# Patient Record
Sex: Female | Born: 2005 | Race: Black or African American | Hispanic: No | Marital: Single | Smoking: Current every day smoker
Health system: Southern US, Community
[De-identification: ages and names within clinical notes are randomized; demographics above are authoritative.]

## PROBLEM LIST (undated history)

## (undated) DIAGNOSIS — R569 Unspecified convulsions: Secondary | ICD-10-CM

## (undated) DIAGNOSIS — T7840XA Allergy, unspecified, initial encounter: Secondary | ICD-10-CM

## (undated) DIAGNOSIS — E669 Obesity, unspecified: Secondary | ICD-10-CM

## (undated) DIAGNOSIS — D573 Sickle-cell trait: Secondary | ICD-10-CM

## (undated) HISTORY — DX: Unspecified convulsions: R56.9

## (undated) HISTORY — PX: TONSILLECTOMY: SUR1361

## (undated) HISTORY — DX: Sickle-cell trait: D57.3

## (undated) HISTORY — DX: Allergy, unspecified, initial encounter: T78.40XA

## (undated) HISTORY — DX: Obesity, unspecified: E66.9

---
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
## 2023-01-11 ENCOUNTER — Ambulatory Visit: Payer: Medicaid Other

## 2023-04-12 ENCOUNTER — Ambulatory Visit

## 2023-04-12 DIAGNOSIS — F331 Major depressive disorder, recurrent, moderate: Secondary | ICD-10-CM | POA: Diagnosis not present

## 2023-04-12 NOTE — BH Specialist Note (Addendum)
 Integrated Behavioral Health via Telemedicine Visit  04/12/2023 Hayley Gould [id number]  Number of Integrated Behavioral Health Clinician visits: 9 Session Start time: 10:20am Session End time: 11:18am Total time in minutes: 58 mins  Referring Provider: Dr. Karilyn Gould Patient/Family location: Home [facility] Provider location: Clinic All persons participating in visit: Patient and Clinician  Types of Service: Individual psychotherapy and Video visit  I connected with Hayley Gould via [employer]  (Video is [employer], mining) and verified that I am speaking with the correct person using two identifiers. Discussed confidentiality: Yes   I discussed the limitations of telemedicine and the availability of in person appointments.  Discussed there is a possibility of technology failure and discussed alternative modes of communication if that failure occurs.  I discussed that engaging in this telemedicine visit, they consent to the provision of behavioral healthcare and the services will be billed under their insurance.  Patient and/or legal guardian expressed understanding and consented to Telemedicine visit: Yes   Presenting Concerns: Patient and/or family reports the following symptoms/concerns: The Patient reports that he has been experiencing increased stress at home and conflict with Mom recently.  Duration of problem: about there months; Severity of problem: moderate  Patient and/or Family's Strengths/Protective Factors: Social connections and Sense of purpose  Goals Addressed: Patient will:  Reduce symptoms of: agitation, anxiety, and stress   Increase knowledge and/or ability of: coping skills, healthy habits, and stress reduction   Demonstrate ability to: Increase healthy adjustment to current life circumstances, Increase adequate support systems for patient/family, and Increase motivation to adhere to plan of care  Progress towards  Goals: Ongoing  Interventions: Interventions utilized:  Solution-Focused Strategies, CBT Cognitive Behavioral Therapy, and Communication Skills Standardized Assessments completed: Not Needed  Patient and/or Family Response: The Patient is easily engaged and able to explore stressors within family dynamics as well as triggers associated with childhood triggers.  Assessment: Patient currently experiencing increased anger and stress related to finances and role expectations in the household.  The Patient reports that despite increase in depressive symptoms he has been able to maintain positive functioning including positive relationship dynamics with significant other, work expectations at a new job and efforts to keep up with financial expectations. The Clinician explored stressors, patterns of co-dependence with Mom and triggers of abandonment related to the Patient's efforts to shift focus to his new partner and decrease enmeshment with Mom.  The Clinician used CBT to explore points of change and reframing to help shift focus to more tangible and measurable outcomes.  The Clinician reviewed I statements and communication tools to help maintain goal directed verbal and behavioral expression with others.  The Clinician challenged retaliation patterns when triggered and explored secondary gains with improving skills coping with triggers that carry over to other relationships outside of Mom also.  Patient may benefit from follow up in about two weeks for a family session with Mom.  Plan: Follow up with behavioral health clinician in about two weeks Behavioral recommendations: continue therapy Referral(s): Integrated [employer] (In Clinic)  I discussed the assessment and treatment plan with the patient and/or parent/guardian. They were provided an opportunity to ask questions and all were answered. They agreed with the plan and demonstrated an understanding of the instructions.   They  were advised to call back or seek an in-person evaluation if the symptoms worsen or if the condition fails to improve as anticipated.  Katheran Awe, [facility]

## 2023-04-25 ENCOUNTER — Ambulatory Visit: Payer: Self-pay

## 2023-04-25 NOTE — BH Specialist Note (Deleted)
 Integrated Behavioral Health Follow Up In-Person Visit  MRN: [id number] Name: VIERA OKONSKI  Number of Integrated Behavioral Health Clinician visits: No data recorded Session Start time: No data recorded  Session End time: No data recorded Total time in minutes: No data recorded  Types of Service: {CHL AMB TYPE OF [phone]}  Interpretor:{yes [id number]} Interpretor Name and Language: ***  Subjective: ELLEEN COULIBALY is a 18 y.o. adult accompanied by {Patient accompanied by:[phone]} Patient was referred by *** for ***. Patient reports the following symptoms/concerns: *** Duration of problem: ***; Severity of problem: {Mild/Moderate/Severe:20260}  Objective: Mood: {BHH MOOD:22306} and Affect: {BHH AFFECT:22307} Risk of harm to self or others: {CHL AMB BH Suicide Current Mental Status:21022748}  Life Context: Family and Social: *** School/Work: *** Self-Care: *** Life Changes: ***  Patient and/or Family's Strengths/Protective Factors: {CHL AMB BH PROTECTIVE [phone]}  Goals Addressed: Patient will:  Reduce symptoms of: {IBH Symptoms:21014056}   Increase knowledge and/or ability of: {IBH Patient Tools:21014057}   Demonstrate ability to: {IBH Goals:21014053}  Progress towards Goals: {CHL AMB BH PROGRESS TOWARDS GOALS:[phone]}  Interventions: Interventions utilized:  {IBH Interventions:21014054} Standardized Assessments completed: {IBH Screening Tools:21014051}  Patient and/or Family Response: ***  Patient Centered Plan: Patient is on the following Treatment Plan(s): *** Assessment: Patient currently experiencing ***.   Patient may benefit from ***.  Plan: Follow up with behavioral health clinician on : *** Behavioral recommendations: *** Referral(s): {IBH Referrals:21014055} "From scale of 1-10, how likely are you to follow plan?": ***  Katheran Awe, [facility]

## 2023-05-13 ENCOUNTER — Emergency Department (HOSPITAL_COMMUNITY)

## 2023-05-13 ENCOUNTER — Other Ambulatory Visit: Payer: Self-pay

## 2023-05-13 ENCOUNTER — Encounter (HOSPITAL_COMMUNITY): Payer: Self-pay

## 2023-05-13 ENCOUNTER — Emergency Department (HOSPITAL_COMMUNITY)
Admission: EM | Admit: 2023-05-13 | Discharge: 2023-05-13 | Disposition: A | Discharge status: Home / Self Care | Attending: Emergency Medicine | Admitting: Emergency Medicine

## 2023-05-13 DIAGNOSIS — K921 Melena: Secondary | ICD-10-CM | POA: Diagnosis not present

## 2023-05-13 DIAGNOSIS — K625 Hemorrhage of anus and rectum: Secondary | ICD-10-CM | POA: Diagnosis not present

## 2023-05-13 DIAGNOSIS — R197 Diarrhea, unspecified: Secondary | ICD-10-CM | POA: Diagnosis not present

## 2023-05-13 DIAGNOSIS — R1032 Left lower quadrant pain: Secondary | ICD-10-CM | POA: Diagnosis not present

## 2023-05-13 DIAGNOSIS — Z9049 Acquired absence of other specified parts of digestive tract: Secondary | ICD-10-CM | POA: Diagnosis not present

## 2023-05-13 LAB — URINALYSIS, ROUTINE W REFLEX MICROSCOPIC
Bilirubin Urine: NEGATIVE
Glucose, UA: NEGATIVE mg/dL
Hgb urine dipstick: NEGATIVE
Ketones, ur: NEGATIVE mg/dL
Leukocytes,Ua: NEGATIVE
Nitrite: NEGATIVE
Protein, ur: NEGATIVE mg/dL
Specific Gravity, Urine: 1.006 (ref 1.005–1.030)
pH: 8 (ref 5.0–8.0)

## 2023-05-13 LAB — COMPREHENSIVE METABOLIC PANEL WITH GFR
ALT: 12 U/L (ref 0–44)
AST: 25 U/L (ref 15–41)
Albumin: 4.4 g/dL (ref 3.5–5.0)
Alkaline Phosphatase: 62 U/L (ref 38–126)
Anion gap: 8 (ref 5–15)
BUN: 6 mg/dL (ref 6–20)
CO2: 25 mmol/L (ref 22–32)
Calcium: 9.6 mg/dL (ref 8.9–10.3)
Chloride: 103 mmol/L (ref 98–111)
Creatinine, Ser: 0.65 mg/dL (ref 0.44–1.00)
GFR, Estimated: >60 mL/min (ref >60)
Glucose, Bld: 101 mg/dL — ABNORMAL HIGH (ref 70–99)
Potassium: 4 mmol/L (ref 3.5–5.1)
Sodium: 136 mmol/L (ref 135–145)
Total Bilirubin: 1 mg/dL (ref 0.0–1.2)
Total Protein: 8.3 g/dL — ABNORMAL HIGH (ref 6.5–8.1)

## 2023-05-13 LAB — PREGNANCY, URINE: Preg Test, Ur: NEGATIVE

## 2023-05-13 LAB — CBC
HCT: 39.5 % (ref 36.0–46.0)
Hemoglobin: 14 g/dL (ref 12.0–15.0)
MCH: 28.7 pg (ref 26.0–34.0)
MCHC: 35.4 g/dL (ref 30.0–36.0)
MCV: 81.1 fL (ref 80.0–100.0)
Platelets: 267 10*3/uL (ref 150–400)
RBC: 4.87 MIL/uL (ref 3.87–5.11)
RDW: 13.2 % (ref 11.5–15.5)
WBC: 8.2 10*3/uL (ref 4.0–10.5)
nRBC: 0 % (ref 0.0–0.2)

## 2023-05-13 LAB — POC OCCULT BLOOD, ED: Fecal Occult Bld: NEGATIVE

## 2023-05-13 LAB — POC URINE PREG, ED: Preg Test, Ur: NEGATIVE

## 2023-05-13 LAB — LIPASE, BLOOD: Lipase: 26 U/L (ref 11–51)

## 2023-05-13 MED ORDER — SODIUM CHLORIDE 0.9 % IV BOLUS
1000.0000 mL | Freq: Once | INTRAVENOUS | Status: AC
  Administered 2023-05-13: 1000 mL via INTRAVENOUS

## 2023-05-13 MED ORDER — DICYCLOMINE HCL 10 MG PO CAPS
20.0000 mg | ORAL_CAPSULE | Freq: Once | ORAL | Status: AC
  Administered 2023-05-13: 20 mg via ORAL
  Filled 2023-05-13: qty 2

## 2023-05-13 MED ORDER — DIPHENHYDRAMINE HCL 25 MG PO CAPS
25.0000 mg | ORAL_CAPSULE | Freq: Once | ORAL | Status: AC
  Administered 2023-05-13: 25 mg via ORAL
  Filled 2023-05-13: qty 1

## 2023-05-13 MED ORDER — IOHEXOL 300 MG/ML  SOLN
100.0000 mL | Freq: Once | INTRAMUSCULAR | Status: AC | PRN
  Administered 2023-05-13: 100 mL via INTRAVENOUS

## 2023-05-13 MED ORDER — DICYCLOMINE HCL 20 MG PO TABS: 20.0000 mg | ORAL_TABLET | Freq: Two times a day (BID) | ORAL | 0 refills | Status: AC

## 2023-05-13 NOTE — ED Triage Notes (Signed)
 Pt states having abdominal pain for over two weeks. Pt states bright red blood noted to stool and at first blamed it on her menstrual but "it's a lot." Pt states coming in today for continuing abdominal pain.

## 2023-05-13 NOTE — ED Provider Notes (Signed)
 [place] EMERGENCY DEPARTMENT AT [facility] Provider Note   CSN: [id number] Arrival date & time: 05/13/23  1345     History  Chief Complaint  Patient presents with   Abdominal Pain    Hayley Gould is a 18 y.o. adult.  Presents the ER today for evaluation of lower abdominal pain that has been intermittent with diarrhea.  This started 2 weeks ago with several days of blood in the stool.  They report it was only blood, not blood mixed with stool.  This happened multiple times a day for several days.  Since that time they have only been having liquid stools several times a day.  They deny diet changes, deny any rectal trauma, deny urinary complaints, no fevers or chills.  No history of the same.  They have never had a colonoscopy.   Abdominal Pain      Home Medications Prior to Admission medications   Medication Sig Start Date End Date Taking? Authorizing Provider  busPIRone  (BUSPAR ) 7.5 MG tablet Take 7.5 mg by mouth 2 (two) times daily. 06/07/22   [provider]  cetirizine  (ZYRTEC ) 10 MG tablet Take 1 tablet (10 mg total) by mouth daily. Patient not taking: Reported on 08/04/2022 05/12/22   Camilla Cedar, MD  diphenhydrAMINE  (BENADRYL ) 25 mg capsule Take 25 mg by mouth at bedtime as needed for sleep.    [provider]  docusate sodium  (COLACE) 100 MG capsule Take 1 capsule (100 mg total) by mouth 2 (two) times daily. Patient not taking: Reported on 08/04/2022 07/05/22 07/05/23  Pappayliou, Bynum Cassis A, DO  ibuprofen  (ADVIL ) 200 MG tablet Take 200 mg by mouth every 4 (four) hours as needed for mild pain.    [provider]  naproxen sodium (ALEVE) 220 MG tablet Take 220 mg by mouth daily as needed (pain).    [provider]  oxyCODONE  (ROXICODONE ) 5 MG immediate release tablet Take 1 tablet (5 mg total) by mouth every 6 (six) hours as needed. Patient not taking: Reported on 07/19/2022 07/05/22   Pappayliou, Bynum Cassis A, DO       Allergies    Patient has no known allergies.    Review of Systems   Review of Systems  Gastrointestinal:  Positive for abdominal pain.    Physical Exam Updated Vital Signs BP (!) 142/90 (BP Location: Right Arm)   Pulse 74   Temp 97.9 F (36.6 C) (Oral)   Resp 16   Ht 5\' 4"  (1.626 m)   Wt 71.8 kg   LMP 04/29/2023   SpO2 98%   BMI 27.17 kg/m  Physical Exam Vitals and nursing note reviewed.  Constitutional:      General: He is not in acute distress.    Appearance: He is well-developed.  HENT:     Head: Normocephalic and atraumatic.     Mouth/Throat:     Mouth: Mucous membranes are moist.  Eyes:     Extraocular Movements: Extraocular movements intact.     Conjunctiva/sclera: Conjunctivae normal.     Pupils: Pupils are equal, round, and reactive to light.  Cardiovascular:     Rate and Rhythm: Normal rate and regular rhythm.     Heart sounds: No murmur heard. Pulmonary:     Effort: Pulmonary effort is normal. No respiratory distress.     Breath sounds: Normal breath sounds.  Abdominal:     Palpations: Abdomen is soft.     Tenderness: There is abdominal tenderness in the right lower quadrant, suprapubic  area and left lower quadrant. There is no right CVA tenderness, left CVA tenderness, guarding or rebound.  Genitourinary:    Rectum: Normal. Guaiac result negative.     Comments: Rectal exam chaperoned by RN, small amount of loose brown stool, glove, guaiac negative Musculoskeletal:        General: No swelling.     Cervical back: Neck supple.  Skin:    General: Skin is warm and dry.     Capillary Refill: Capillary refill takes less than 2 seconds.  Neurological:     General: No focal deficit present.     Mental Status: He is alert and oriented to person, place, and time.  Psychiatric:        Mood and Affect: Mood normal.        Behavior: Behavior normal.     ED Results / Procedures / Treatments   Labs (all labs ordered are listed, but only abnormal results  are displayed) Labs Reviewed  LIPASE, BLOOD  COMPREHENSIVE METABOLIC PANEL WITH GFR  CBC  URINALYSIS, ROUTINE W REFLEX MICROSCOPIC  POC URINE PREG, ED    EKG None  Radiology No results found.  Procedures Procedures    Medications Ordered in ED Medications - No data to display  ED Course/ Medical Decision Making/ A&P Clinical Course as of 05/13/23 1823  Sat May 13, 2023  1822 Here for several weeks of liquid stool and lower abdominal pain that is intermittent.  Initially diarrhea was "pure blood" no more blood however negative Hemoccult.  Labs are normal, waiting on CT.  Patient did not have recent antibiotics, is going to try to provide stool sample while here if possible.  Complaining of some sharp intermittent pain now so Bentyl  ordered and asking for something for allergies so ordered Benadryl . [CB]    Clinical Course User Index [CB] Aimee Houseman, PA-C                                 Medical Decision Making This patient presents to the ED for concern of lower abdominal pain, diarrhea, this involves an extensive number of treatment options, and is a complaint that carries with it a high risk of complications and morbidity.  The differential diagnosis includes but colitis, infectious diarrhea,, diverticulitis, gastroenteritis, other   Co morbidities that complicate the patient evaluation  Seasonal allergies   Additional history obtained:  Additional history obtained from EMR External records from outside source obtained and reviewed including prior notes   Lab Tests:  I Ordered, and personally interpreted labs.  The pertinent results include: Normal CBC, CMP, lipase, UA, patient is not pregnant, POC occult blood negative   Imaging Studies ordered:  I ordered imaging studies including CT abdomen pelvis I independently visualized and interpreted imaging which showed no acute findings I agree with the radiologist interpretation     Problem List / ED  Course / Critical interventions / Medication management  Diarrhea-patient had bloody diarrhea about 2 weeks ago and has been having liquid stool since then with no more blood in the stool.  Hemoccult today is negative, no rectal pain or tenderness, abdomen is soft with mild lower abdominal tenderness.  Given continued symptoms with abdominal tenderness CT was ordered and shows no acute findings, she did not have any recent antibiotics or recent sick contacts.  She has no fever or leukocytosis.  I discussed with patient she will likely need outpatient follow-up with  GI.  She is given Bentyl  for abdominal cramping here with some relief of her symptoms.  She is nontoxic in appearance, she is well-hydrated.  She is advised to use Imodium for several days OTC to see if this will help with her diarrhea.  Patient not able to provide stool sample in the ED I have reviewed the patients home medicines and have made adjustments as needed    Amount and/or Complexity of Data Reviewed Labs: ordered. Radiology: ordered.  Risk Prescription drug management.           Final Clinical Impression(s) / ED Diagnoses Final diagnoses:  None    Rx / DC Orders ED Discharge Orders     None         Joshua Nieves 05/13/23 2241    Cheyenne Cotta, MD 05/16/23 1121

## 2023-05-13 NOTE — ED Notes (Signed)
 Lab processing pregnancy urine.

## 2023-05-13 NOTE — Discharge Instructions (Signed)
 You are seen in the ER today for abdominal pain and diarrhea with blood in your stool.  There is no blood in your stool today and your blood counts are normal fortunately.  We did a CT scan that did not show any acute findings.  You can take over-the-counter Imodium to help with your diarrhea as directed on packaging for a short period.  You need to follow-up with gastroenterology.  Come back to the ER if you have fevers, worsening pain, vomiting or any other worrisome changes.

## 2023-05-13 NOTE — ED Notes (Signed)
Patient verbalizes understanding of discharge instructions. Opportunity for questioning and answers were provided. Armband removed by staff, pt discharged from ED. Ambulated out to lobby with friend

## 2023-05-14 LAB — POC URINE PREG, ED: Preg Test, Ur: NEGATIVE

## 2023-07-04 ENCOUNTER — Ambulatory Visit

## 2023-10-13 ENCOUNTER — Encounter: Payer: Self-pay | Admitting: *Deleted

## 2023-12-26 ENCOUNTER — Ambulatory Visit

## 2023-12-26 DIAGNOSIS — F331 Major depressive disorder, recurrent, moderate: Secondary | ICD-10-CM

## 2023-12-26 NOTE — BH Specialist Note (Signed)
 Integrated Behavioral Health Follow Up In-Person Visit  MRN: [id number] Name: Hayley Gould  Number of Integrated Behavioral Health Clinician visits: 1/6 Session Start time: 11:05am Session End time: 12:00pm Total time in minutes: 55 mins   Types of Service: Individual psychotherapy  Interpretor:No.  Subjective: Hayley Gould is a 18 y.o. who identifies as female and prefers to use he/him pronouns in all settings. Patient was referred by Dr. Caswell due to history of Depression and Anxiety. Patient reports the following symptoms/concerns: Patient reports that he gets shifts often through depressed and manic cycles.  Duration of problem: several months; Severity of problem: moderate   Objective: Mood: Liable Affect: Neural Risk of harm to self or others: Pt has history of SI with passive nature for several years.  In the past SI has been more severe in frequency and intensity but the Patient does not define a plan, intent and currently feels hopeful about future outcomes despite recent challenges.    Life Context: Family and Social: Patient lives with Mom.  Patient's Brother moved out recently. School/Work: The Patient is currently not in school and plans to work full time.  Until recently the Patient was planning to attend [employer] but due to financial limitations could not start this semester.   Self-Care: Patient is working on means to become more independent and save up for money required to begin college (aiming for next year).  The Patient is also in the process of gaining driving experience to get his license. The Patient helps support his household and family financially as well as  he has done for several years now.  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    Patient and/or Family's Strengths/Protective Factors: Social connections,  Concrete supports in place (healthy food, safe environments, etc.) and Physical Health (exercise, healthy diet, medication compliance, etc.)   Goals Addressed: Patient will:  Reduce symptoms of: agitation, anxiety and depression   Increase knowledge and/or ability of: coping skills and healthy habits   Demonstrate ability to: Increase healthy adjustment to current life circumstances and Increase adequate support systems for patient/family   Progress towards Goals: Ongoing   Interventions: Interventions utilized:  Mindfulness or Management Consultant, CBT Cognitive Behavioral Therapy and Sleep Hygiene Standardized Assessments completed: None Needed    Patient and/or Family Response: Patient presents with overall positive affect but less driven and upbeat as compared to last session.    Patient Centered Plan: Patient is on the following Treatment Plan(s): Develop self regulation skills and improve self confidence.  Clinical Assessment/Diagnosis  Major depressive disorder, recurrent episode, moderate with anxious distress (HCC)    Assessment: Patient currently experiencing transition to a new work environment and more adult responsibilities. The Patient reports he has moved out of his home with Mom and significant other to an apartment with a friend and significant other and although financially this is stressful feels a positive sense of accomplishment with this change.  The Patient processed difficulty in accepting a sense of security after many years of trauma related to poverty and inconsistent ability to meet basic needs.  The Clinician explored behavioral patterns such as support testing and avoidance as part of trauma response and coping tools that he has adapted over the years.  The Clinician explored challenges with heightened awareness of gender dysphoria with more intimate and mature relationship dynamics and internal pressure  around failure to meet intended goal for working towards  hormone replacement therapies.  The Clinician validated positive change noted including decreased financial insecurity, improved relationship dynamics in home and personal spaces, and improved ability to establish trust within relationships to express emotions more openly.  The Clinician explored planning to ensure the Patient is able to maintain health care, re-connect with psychiatry and continue counseling support with referral to [facility] for ongoing care given age and limits of pediatric care.  The Patient voiced fears with transition but also affirmed awareness that action to establish resources before it becomes a crisis is preferred over having a forced process due to lack of care when needed.   Patient may benefit from follow up in about two weeks to ensure that referral to link to new health care provider and mental health providers is completed.   Plan: Follow up with behavioral health clinician in about two weeks Behavioral recommendations: continue therapy Referral(s): Integrated [employer] (In Clinic)  Slater Somerset, [facility]

## 2024-01-09 ENCOUNTER — Ambulatory Visit: Payer: Self-pay

## 2024-01-09 NOTE — BH Specialist Note (Incomplete)
 Integrated Behavioral Health Follow Up In-Person Visit  MRN: [id number] Name: Hayley Gould  Number of Integrated Behavioral Health Clinician visits: No data recorded Session Start time: No data recorded  Session End time: No data recorded Total time in minutes: No data recorded   Types of Service: {CHL AMB TYPE OF SERVICE:[phone]}  Interpretor:{yes wn:[id number]} Interpretor Name and Language: *** Subjective: Hayley Gould is a 18 y.o. who identifies as female and prefers to use he/him pronouns in all settings. Patient was referred by Dr. Caswell due to history of Depression and Anxiety. Patient reports the following symptoms/concerns: Patient reports that he gets shifts often through depressed and manic cycles.  Duration of problem: several months; Severity of problem: moderate   Objective: Mood: Liable Affect: Neural Risk of harm to self or others: Pt has history of SI with passive nature for several years.  In the past SI has been more severe in frequency and intensity but the Patient does not define a plan, intent and currently feels hopeful about future outcomes despite recent challenges.    Life Context: Family and Social: Patient lives with Mom.  Patient's Brother moved out recently. School/Work: The Patient is currently not in school and plans to work full time.  Until recently the Patient was planning to attend [employer] but due to financial limitations could not start this semester.   Self-Care: Patient is working on means to become more independent and save up for money required to begin college (aiming for next year).  The Patient is also in the process of gaining driving experience to get his license. The Patient helps support his household and family financially as well as  he has done for several years now.  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    Patient and/or Family's Strengths/Protective Factors: Social connections, Concrete supports in place (healthy food, safe environments, etc.) and Physical Health (exercise, healthy diet, medication compliance, etc.)   Goals Addressed: Patient will:  Reduce symptoms of: agitation, anxiety and depression   Increase knowledge and/or ability of: coping skills and healthy habits   Demonstrate ability to: Increase healthy adjustment to current life circumstances and Increase adequate support systems for patient/family   Progress towards Goals: Ongoing   Interventions: Interventions utilized:  Mindfulness or Management Consultant, CBT Cognitive Behavioral Therapy and Sleep Hygiene Standardized Assessments completed: None Needed    Patient and/or Family Response: Patient presents with overall positive affect but less driven and upbeat as compared to last session.    Patient Centered Plan: Patient is on the following Treatment Plan(s): Develop self regulation skills and improve self confidence.   Clinical Assessment/Diagnosis   Major depressive disorder, recurrent episode, moderate with anxious distress (HCC)   Assessment: Patient currently experiencing ***.   Patient may benefit from ***.  Plan: Follow up with behavioral health clinician on : *** Behavioral recommendations: *** Referral(s): {IBH Referrals:[id number]}  Slater Somerset, [facility]
